# Patient Record
Sex: Male | Born: 2005
Health system: Southern US, Community
[De-identification: ages and names within clinical notes are randomized; demographics above are authoritative.]

## PROBLEM LIST (undated history)

## (undated) DIAGNOSIS — H9190 Unspecified hearing loss, unspecified ear: Secondary | ICD-10-CM

## (undated) DIAGNOSIS — F419 Anxiety disorder, unspecified: Secondary | ICD-10-CM

## (undated) DIAGNOSIS — T7840XA Allergy, unspecified, initial encounter: Secondary | ICD-10-CM

## (undated) DIAGNOSIS — G4700 Insomnia, unspecified: Secondary | ICD-10-CM

## (undated) DIAGNOSIS — F909 Attention-deficit hyperactivity disorder, unspecified type: Secondary | ICD-10-CM

## (undated) DIAGNOSIS — F329 Major depressive disorder, single episode, unspecified: Secondary | ICD-10-CM

## (undated) DIAGNOSIS — F840 Autistic disorder: Secondary | ICD-10-CM

## (undated) DIAGNOSIS — F32A Depression, unspecified: Secondary | ICD-10-CM

## (undated) HISTORY — DX: Allergy, unspecified, initial encounter: T78.40XA

## (undated) HISTORY — DX: Depression, unspecified: F32.A

## (undated) HISTORY — DX: Attention-deficit hyperactivity disorder, unspecified type: F90.9

## (undated) HISTORY — DX: Unspecified hearing loss, unspecified ear: H91.90

## (undated) HISTORY — DX: Anxiety disorder, unspecified: F41.9

## (undated) HISTORY — DX: Insomnia, unspecified: G47.00

## (undated) HISTORY — DX: Major depressive disorder, single episode, unspecified: F32.9

## (undated) HISTORY — DX: Autistic disorder: F84.0

---
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
## 2024-08-14 ENCOUNTER — Emergency Department (HOSPITAL_COMMUNITY): Payer: Self-pay

## 2024-08-14 ENCOUNTER — Other Ambulatory Visit: Payer: Self-pay

## 2024-08-14 ENCOUNTER — Observation Stay (HOSPITAL_COMMUNITY)
Admission: EM | Admit: 2024-08-14 | Discharge: 2024-08-16 | Disposition: A | Discharge status: Home / Self Care | Payer: Self-pay | Attending: Internal Medicine | Admitting: Internal Medicine

## 2024-08-14 ENCOUNTER — Encounter (HOSPITAL_COMMUNITY): Payer: Self-pay | Admitting: Emergency Medicine

## 2024-08-14 DIAGNOSIS — R519 Headache, unspecified: Secondary | ICD-10-CM

## 2024-08-14 DIAGNOSIS — R202 Paresthesia of skin: Secondary | ICD-10-CM | POA: Insufficient documentation

## 2024-08-14 DIAGNOSIS — I4711 Inappropriate sinus tachycardia, so stated: Secondary | ICD-10-CM

## 2024-08-14 DIAGNOSIS — R079 Chest pain, unspecified: Secondary | ICD-10-CM | POA: Insufficient documentation

## 2024-08-14 DIAGNOSIS — R000 Tachycardia, unspecified: Principal | ICD-10-CM | POA: Diagnosis present

## 2024-08-14 MED ORDER — SODIUM CHLORIDE 0.9 % IV BOLUS
1000.0000 mL | Freq: Once | INTRAVENOUS | Status: AC
  Administered 2024-08-14: 1000 mL via INTRAVENOUS

## 2024-08-14 MED ORDER — LORAZEPAM 2 MG/ML IJ SOLN
1.0000 mg | Freq: Once | INTRAMUSCULAR | Status: AC
  Administered 2024-08-14: 1 mg via INTRAVENOUS
  Filled 2024-08-14: qty 1

## 2024-08-14 NOTE — ED Triage Notes (Signed)
 Pt bib ems from home with initial c/o shob, pt to be tachycardic at 170s upon arrival. HR 140s here. Abruptly stopped his clonidine  x 1 week ago.  NS  160/80 170HR 99RA, placed on 2lpm [place] for comfort

## 2024-08-14 NOTE — ED Provider Notes (Signed)
 " [facility]-EMERGENCY DEPT [facility] Emergency Department Provider Note MRN:  [id number]  Arrival date & time: 08/15/24     Chief Complaint   Tachycardia   History of Present Illness   Franklin Arnold is a 19 y.o. year-old male with a history of autism presenting to the ED with chief complaint of tachycardia.  Sudden onset shortness of breath and tachycardia at home.  Reportedly heart rate in the 170s.  Patient endorsing some mild chest discomfort, some migratory tingling to the hands and legs.  Denies headache, no numbness or weakness, no abdominal pain.  Shortness of breath is a bit improved.  Recently stopped his clonidine .  Review of Systems  A thorough review of systems was obtained and all systems are negative except as noted in the HPI and PMH.   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   Insomnia     Past Surgical History:  Procedure Laterality Date   ADENOIDECTOMY N/A 2006    Family History  Problem Relation Age of Onset   Hypertension Mother    Hypertension Father    Diabetes Father    ADD / ADHD Brother    Schizophrenia Paternal Aunt    Heart failure Maternal Grandmother    Bipolar disorder Paternal Grandmother     Social History   Socioeconomic History   Marital status: Single    Spouse name: Not on file   Number of children: Not on file   Years of education: Not on file   Highest education level: Not on file  Occupational History   Occupation: student    Comment: [employer]  Tobacco Use   Smoking status: Never   Smokeless tobacco: Never  Vaping Use   Vaping status: Never Used  Substance and Sexual Activity   Alcohol use: No   Drug use: No   Sexual activity: Never  Other Topics Concern   Not on file  Social History Narrative   Parents are divorced.    Lives with his mother, one brother and their cat.   Two brothers and sister live independently in [place], [place].   His father is remarried with children and lives in [place], [place].   The patient desires more contact with his father.   Social Drivers of Health   Tobacco Use: Low Risk (08/14/2024)   Patient History    Smoking Tobacco Use: Never    Smokeless Tobacco Use: Never    Passive Exposure: Not on file  Financial Resource Strain: Not on file  Food Insecurity: Not on file  Transportation Needs: Not on file  Physical Activity: Not on file  Stress: Not on file  Social Connections: Not on file  Intimate Partner Violence: Not on file  Depression (PHQ2-9): Medium Risk (12/24/2022)   Depression (PHQ2-9)    PHQ-2 Score: 10  Alcohol Screen: Low Risk (12/24/2022)   Alcohol Screen    Last Alcohol Screening Score (AUDIT): 0  Housing: Not on file  Utilities: Not on file  Health Literacy: Not on file     Physical Exam   Vitals:   08/15/24 0300 08/15/24 0313  BP: 120/72   Pulse: (!) 121 (!) 111  Resp: 19 (!) 22  Temp:    SpO2: 100% 98%    CONSTITUTIONAL:  Well-appearing, mildly anxious NEURO/PSYCH:  Alert and oriented x 3, no focal deficits EYES:  eyes equal and reactive ENT/NECK:  no LAD, no JVD CARDIO: Tachycardic rate, well-perfused, normal S1 and S2 PULM:  CTAB no wheezing or rhonchi GI/GU:  non-distended, non-tender MSK/SPINE:  No gross deformities, no edema SKIN:  no rash, atraumatic   *Additional and/or pertinent findings included in MDM below  Diagnostic and Interventional Summary    EKG Interpretation Date/Time:  Tuesday August 15 2024 01:55:07 EST Ventricular Rate:  125 PR Interval:  166 QRS Duration:  77 QT Interval:  303 QTC Calculation: 437 R Axis:   111  Text Interpretation: Sinus tachycardia LAE, consider biatrial enlargement Right axis deviation Confirmed by Theadore Sharper [phone]) on 08/15/2024 2:14:00 AM       Labs Reviewed  CBC - Abnormal;  Notable for the following components:      Result Value   WBC 12.7 (*)    RBC 6.32 (*)    Hemoglobin 18.8 (*)    HCT 53.9 (*)    All other components within normal limits  BASIC METABOLIC PANEL WITH GFR - Abnormal; Notable for the following components:   Chloride 97 (*)    Glucose, Bld 135 (*)    Creatinine, Ser 1.35 (*)    Anion gap 19 (*)    All other components within normal limits  MAGNESIUM  TROPONIN T, HIGH SENSITIVITY  TROPONIN T, HIGH SENSITIVITY    CT Angio Chest Pulmonary Embolism (PE) W or WO Contrast  Final Result    DG Chest Port 1 View  Final Result      Medications  iohexol  (OMNIPAQUE ) 350 MG/ML injection 75 mL (has no administration in time range)  sodium chloride  0.9 % bolus 1,000 mL (0 mLs Intravenous Stopped 08/15/24 0111)  LORazepam  (ATIVAN ) injection 1 mg (1 mg Intravenous Given 08/14/24 2349)  iohexol  (OMNIPAQUE ) 350 MG/ML injection 75 mL (75 mLs Intravenous Contrast Given 08/15/24 0108)  sodium chloride  0.9 % bolus 1,000 mL (1,000 mLs Intravenous New Bag/Given 08/15/24 0146)     Procedures  /  Critical Care Procedures  ED Course and Medical Decision Making  Initial Impression and Ddx Differential diagnosis includes arrhythmia, pulmonary embolism, panic attack, pneumothorax heart rate is improved down to the 120s to 130s, hypertensive on arrival as well.  Providing some fluids, Ativan , will reassess.  Past medical/surgical history that increases complexity of ED encounter: Autism  Interpretation of Diagnostics I personally reviewed the EKG and my interpretation is as follows: Sinus tachycardia  No significant blood count or electrolyte disturbance.  Some evidence of hemoconcentration.  CTA is unremarkable.  Patient Reassessment and Ultimate Disposition/Management     Patient continues to exhibit tachycardia, cardiology consulted with the question is patient in some type of subtle SVT or ectopic atrial tachycardia.  Dr. Curtistine Farr evaluated the  patient in person and will admit patient for observation.  Patient management required discussion with the following services or consulting groups:  Cardiology  Complexity of Problems Addressed Acute illness or injury that poses threat of life of bodily function  Additional Data Reviewed and Analyzed Further history obtained from: Further history from spouse/family member  Additional Factors Impacting ED Encounter Risk Consideration of hospitalization  Sharper HERO. Theadore, MD [facility] Emergency Medicine [facility] [email or web address]  Final Clinical Impressions(s) / ED Diagnoses     ICD-10-CM   1. Tachycardia  R00.0       ED Discharge Orders     None  Discharge Instructions Discussed with and Provided to Patient:   Discharge Instructions   None      Theadore Ozell HERO, MD 08/15/24 [phone]  "

## 2024-08-15 ENCOUNTER — Other Ambulatory Visit: Payer: Self-pay | Admitting: Student

## 2024-08-15 ENCOUNTER — Observation Stay (HOSPITAL_COMMUNITY)
Admit: 2024-08-15 | Discharge: 2024-08-15 | Disposition: A | Discharge status: Home / Self Care | Payer: Self-pay | Attending: Internal Medicine | Admitting: Internal Medicine

## 2024-08-15 ENCOUNTER — Emergency Department (HOSPITAL_COMMUNITY): Payer: Self-pay

## 2024-08-15 ENCOUNTER — Observation Stay (HOSPITAL_COMMUNITY): Payer: Self-pay

## 2024-08-15 DIAGNOSIS — R000 Tachycardia, unspecified: Principal | ICD-10-CM

## 2024-08-15 DIAGNOSIS — R519 Headache, unspecified: Secondary | ICD-10-CM

## 2024-08-15 LAB — BASIC METABOLIC PANEL WITH GFR
Anion gap: 11 (ref 5–15)
Anion gap: 19 — ABNORMAL HIGH (ref 5–15)
BUN: 12 mg/dL (ref 6–20)
BUN: 15 mg/dL (ref 6–20)
CO2: 21 mmol/L — ABNORMAL LOW (ref 22–32)
CO2: 22 mmol/L (ref 22–32)
Calcium: 10.2 mg/dL (ref 8.9–10.3)
Calcium: 8.4 mg/dL — ABNORMAL LOW (ref 8.9–10.3)
Chloride: 106 mmol/L (ref 98–111)
Chloride: 97 mmol/L — ABNORMAL LOW (ref 98–111)
Creatinine, Ser: 1.08 mg/dL (ref 0.61–1.24)
Creatinine, Ser: 1.35 mg/dL — ABNORMAL HIGH (ref 0.61–1.24)
GFR, Estimated: >60 mL/min
GFR, Estimated: >60 mL/min
Glucose, Bld: 135 mg/dL — ABNORMAL HIGH (ref 70–99)
Glucose, Bld: 93 mg/dL (ref 70–99)
Potassium: 3.7 mmol/L (ref 3.5–5.1)
Potassium: 4.1 mmol/L (ref 3.5–5.1)
Sodium: 138 mmol/L (ref 135–145)
Sodium: 138 mmol/L (ref 135–145)

## 2024-08-15 LAB — CBC
HCT: 40.1 % (ref 39.0–52.0)
HCT: 42.8 % (ref 39.0–52.0)
HCT: 53.9 % — ABNORMAL HIGH (ref 39.0–52.0)
Hemoglobin: 13.7 g/dL (ref 13.0–17.0)
Hemoglobin: 14.7 g/dL (ref 13.0–17.0)
Hemoglobin: 18.8 g/dL — ABNORMAL HIGH (ref 13.0–17.0)
MCH: 29.2 pg (ref 26.0–34.0)
MCH: 29.3 pg (ref 26.0–34.0)
MCH: 29.7 pg (ref 26.0–34.0)
MCHC: 34.2 g/dL (ref 30.0–36.0)
MCHC: 34.3 g/dL (ref 30.0–36.0)
MCHC: 34.9 g/dL (ref 30.0–36.0)
MCV: 84.9 fL (ref 80.0–100.0)
MCV: 85.3 fL (ref 80.0–100.0)
MCV: 85.9 fL (ref 80.0–100.0)
Platelets: 228 K/uL (ref 150–400)
Platelets: 236 K/uL (ref 150–400)
Platelets: 306 K/uL (ref 150–400)
RBC: 4.67 MIL/uL (ref 4.22–5.81)
RBC: 5.04 MIL/uL (ref 4.22–5.81)
RBC: 6.32 MIL/uL — ABNORMAL HIGH (ref 4.22–5.81)
RDW: 11.8 % (ref 11.5–15.5)
RDW: 11.9 % (ref 11.5–15.5)
RDW: 12 % (ref 11.5–15.5)
WBC: 10 K/uL (ref 4.0–10.5)
WBC: 11.9 K/uL — ABNORMAL HIGH (ref 4.0–10.5)
WBC: 12.7 K/uL — ABNORMAL HIGH (ref 4.0–10.5)
nRBC: 0 % (ref 0.0–0.2)
nRBC: 0 % (ref 0.0–0.2)
nRBC: 0 % (ref 0.0–0.2)

## 2024-08-15 LAB — TROPONIN T, HIGH SENSITIVITY
Troponin T High Sensitivity: <15 ng/L (ref 0–19)
Troponin T High Sensitivity: <15 ng/L (ref 0–19)

## 2024-08-15 LAB — MRSA NEXT GEN BY PCR, NASAL: MRSA by PCR Next Gen: NOT DETECTED

## 2024-08-15 LAB — MAGNESIUM: Magnesium: 2.1 mg/dL (ref 1.7–2.4)

## 2024-08-15 LAB — TSH: TSH: 1.55 u[IU]/mL (ref 0.350–4.500)

## 2024-08-15 MED ORDER — SODIUM CHLORIDE 0.9 % IV BOLUS
1000.0000 mL | Freq: Once | INTRAVENOUS | Status: AC
  Administered 2024-08-15: 1000 mL via INTRAVENOUS

## 2024-08-15 MED ORDER — IOHEXOL 350 MG/ML SOLN
75.0000 mL | Freq: Once | INTRAVENOUS | Status: AC | PRN
  Administered 2024-08-15: 75 mL via INTRAVENOUS

## 2024-08-15 MED ORDER — POLYETHYLENE GLYCOL 3350 17 G PO PACK: 17.0000 g | PACK | Freq: Every day | ORAL | Status: DC | PRN

## 2024-08-15 MED ORDER — ONDANSETRON HCL 4 MG/2ML IJ SOLN
4.0000 mg | Freq: Four times a day (QID) | INTRAMUSCULAR | Status: DC | PRN
  Administered 2024-08-16: 4 mg via INTRAVENOUS
  Filled 2024-08-15: qty 2

## 2024-08-15 MED ORDER — ACETAMINOPHEN 325 MG PO TABS
650.0000 mg | ORAL_TABLET | ORAL | Status: DC | PRN
  Administered 2024-08-16: 650 mg via ORAL
  Filled 2024-08-15: qty 2

## 2024-08-15 MED ORDER — IOHEXOL 350 MG/ML SOLN: 75.0000 mL | Freq: Once | INTRAVENOUS | Status: DC | PRN

## 2024-08-15 MED ORDER — METOPROLOL TARTRATE 25 MG PO TABS
25.0000 mg | ORAL_TABLET | Freq: Two times a day (BID) | ORAL | Status: DC
  Administered 2024-08-15 – 2024-08-16 (×3): 25 mg via ORAL
  Filled 2024-08-15 (×4): qty 1

## 2024-08-15 NOTE — H&P (Addendum)
 "   Cardiology Admission History and Physical   Patient ID: Franklin Arnold MRN: [id number]; DOB: 07-Jun-2006   Admission date: 08/14/2024  PCP:  Donzella Lauraine SAILOR, DO   [facility] Providers Cardiologist:  None       Chief Complaint:  tachycardia  Patient Profile:   Franklin Arnold is a 19 y.o. male with a hx of autism who is being seen 08/15/2024 for the evaluation of tachycardia at the request of Dr. Theadore.   History of Present Illness:   Was in his [place], playing video games tonight. One minute he was feeling normal and the next minute he suddenly appreciated sudden onset palpitations and SOB. He woke up his mom who noticed that he was clammy so called EMS. EMS arrived and HR was 170 (they report sinus tachycardia). He was brought to the emergency room.    In the ED, HR initially 150s, momentarily jumped to 170bpm, and then back down to the 120s. Labs showed AKI with Cr 1.35, up from prior Cr 1-1.2. CBC with all cell lines up compared to prior CBC, WBC 12.7 from 6.9, Hfb 18.8 from 16.2, and Plts 306 from 191. Hstrops negative x2. S/p 2L IVF in the ED, now HR improved to the 120s. CT PE performed, negative.    EKG with positive P wave in I and inferior leads.   He was previously on clonidine  about 1.5 years ago. Had stopped cold turkey 1.5 years ago and had an episode of rebound hypertension. However, has not been on clonidine  for 1.5 years now. Not on any medications. Denies caffeine usage, stimulant usage, EtOH usage. Doesn't think he's dehydrated because he's drinking all the time. Denies fevers, chills, N/V, recent sick contacts.   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   Insomnia     Past Surgical History:  Procedure Laterality Date   ADENOIDECTOMY N/A 2006     Medications Prior to Admission: Prior to  Admission medications  Not on File     Allergies:   Allergies[1]  Social History:   Social History   Socioeconomic History   Marital status: Single    Spouse name: Not on file   Number of children: Not on file   Years of education: Not on file   Highest education level: Not on file  Occupational History   Occupation: student    Comment: [employer]  Tobacco Use   Smoking status: Never   Smokeless tobacco: Never  Vaping Use   Vaping status: Never Used  Substance and Sexual Activity   Alcohol use: No   Drug use: No   Sexual activity: Never  Other Topics Concern   Not on file  Social History Narrative   Parents are divorced.   Lives with his mother, one brother and their cat.   Two brothers and sister live independently in [place], [place].   His father is remarried with children and lives in [place], [place].   The patient desires more contact with his father.   Social Drivers of Health   Tobacco Use: Low Risk (08/14/2024)   Patient History    Smoking Tobacco Use: Never    Smokeless Tobacco Use: Never    Passive Exposure: Not on file  Financial Resource Strain: Not on file  Food  Insecurity: Not on file  Transportation Needs: Not on file  Physical Activity: Not on file  Stress: Not on file  Social Connections: Not on file  Intimate Partner Violence: Not on file  Depression (PHQ2-9): Medium Risk (12/24/2022)   Depression (PHQ2-9)    PHQ-2 Score: 10  Alcohol Screen: Low Risk (12/24/2022)   Alcohol Screen    Last Alcohol Screening Score (AUDIT): 0  Housing: Not on file  Utilities: Not on file  Health Literacy: Not on file    Family History:   The patient's family history includes ADD / ADHD in his brother; Bipolar disorder in his paternal grandmother; Diabetes in his father; Heart failure in his maternal grandmother; Hypertension in his father and mother; Schizophrenia in his paternal aunt.    ROS:  Please see the history of present illness.  All  other ROS reviewed and negative.     Physical Exam/Data:   Vitals:   08/15/24 0154 08/15/24 0223 08/15/24 0230 08/15/24 0254  BP:   138/80   Pulse: (!) 121 (!) 131 (!) 122 (!) 125  Resp: 18 17 13 16   Temp:      SpO2: 100% 100% 100% 99%  Weight:      Height:       No intake or output data in the 24 hours ending 08/15/24 0305    08/14/2024   11:36 PM 12/24/2022    8:32 AM 09/21/2022    8:27 PM  Last 3 Weights  Weight (lbs) 141 lb 1.5 oz 139 lb 1.6 oz 125 lb  Weight (kg) 64 kg 63.095 kg 56.7 kg     Body mass index is 19.68 kg/m.  General:  Well nourished, well developed, in no acute distress HEENT: normal Neck: no JVD Vascular: No carotid bruits; Distal pulses 2+ bilaterally Cardiac:  tachycardic, regular rhythm Lungs:  clear to auscultation bilaterally, no wheezing, rhonchi or rales  Abd: soft, nontender, no hepatomegaly  Ext: no edema Musculoskeletal:  No deformities, BUE and BLE strength normal and equal Skin: warm and dry   EKG:  The EKG was personally reviewed and demonstrates:  sinus tachycardia, LAE, P wave morphology positive in I and in inferior leads and negative in V1 Telemetry:  Telemetry was personally reviewed and demonstrates:  sinus tachycardia initially. There is a period where he HR jumps from HR 120s to HR 170s, P wave does not appear to change axis but soon gets buried into the T wave.  Laboratory Data:  High Sensitivity Troponin:  No results for input(s): TROPONINIHS in the last 720 hours.    Chemistry Recent Labs  Lab 08/14/24 2343  NA 138  K 3.7  CL 97*  CO2 22  GLUCOSE 135*  BUN 15  CREATININE 1.35*  CALCIUM 10.2  MG 2.1  GFRNONAA >60  ANIONGAP 19*    No results for input(s): PROT, ALBUMIN, AST, ALT, ALKPHOS, BILITOT in the last 168 hours. Lipids No results for input(s): CHOL, TRIG, HDL, LABVLDL, LDLCALC, CHOLHDL in the last 168 hours. Hematology Recent Labs  Lab 08/14/24 2343  WBC 12.7*  RBC 6.32*  HGB  18.8*  HCT 53.9*  MCV 85.3  MCH 29.7  MCHC 34.9  RDW 11.8  PLT 306   Thyroid  No results for input(s): TSH, FREET4 in the last 168 hours. BNPNo results for input(s): BNP, PROBNP in the last 168 hours.  DDimer No results for input(s): DDIMER in the last 168 hours.   Radiology/Studies:  CT Angio Chest Pulmonary Embolism (PE) W or  WO Contrast Result Date: 08/15/2024 EXAM: CTA of the Chest with contrast for PE 08/15/2024 01:13:08 AM TECHNIQUE: CTA of the chest was performed without and with the administration of 75 mL of intravenous iohexol  (OMNIPAQUE ) 350 MG/ML injection. Multiplanar reformatted images are provided for review. MIP images are provided for review. Automated exposure control, iterative reconstruction, and/or weight based adjustment of the mA/kV was utilized to reduce the radiation dose to as low as reasonably achievable. COMPARISON: None available. CLINICAL HISTORY: Pulmonary embolism (PE) suspected, high prob. Tachycardia. FINDINGS: PULMONARY ARTERIES: Pulmonary arteries are adequately opacified for evaluation. No pulmonary embolism. Main pulmonary artery is normal in caliber. MEDIASTINUM: The heart and pericardium demonstrate no acute abnormality. There is no acute abnormality of the thoracic aorta. LYMPH NODES: No mediastinal, hilar or axillary lymphadenopathy. LUNGS AND PLEURA: The lungs are without acute process. No focal consolidation or pulmonary edema. No pleural effusion or pneumothorax. UPPER ABDOMEN: Limited images of the upper abdomen are unremarkable. SOFT TISSUES AND BONES: Mild lower thoracic dextroscoliosis. No acute soft tissue abnormality. IMPRESSION: 1. No pulmonary embolism. 2. Negative CT chest. Electronically signed by: Pinkie Pebbles MD 08/15/2024 01:21 AM EST RP Workstation: [id number]   DG Chest Port 1 View Result Date: 08/15/2024 EXAM: 1 VIEW(S) XRAY OF THE CHEST 08/15/2024 12:01:00 AM COMPARISON: 03/06/2016. CLINICAL HISTORY: sob sob FINDINGS: LUNGS  AND PLEURA: Questionable left perihilar opacity which could reflect infiltrate and/or pneumonia. No pleural effusion. No pneumothorax. HEART AND MEDIASTINUM: No acute abnormality of the cardiac and mediastinal silhouettes. BONES AND SOFT TISSUES: No acute osseous abnormality. IMPRESSION: 1. Questionable left perihilar opacity, possibly representing infiltrate and/or pneumonia. Electronically signed by: Franky Crease MD 08/15/2024 12:06 AM EST RP Workstation: [id number]     Assessment and Plan:   WAGNER TANZI is a 19 y.o. male with a hx of autism who is being seen 08/15/2024 for the evaluation of tachycardia at the request of Dr. Theadore.   Tachycardia Sudden onset tachycardia while resting at home seems suspicious for some sort of tachyarrhythemia. Wasn't stressed, scared, denies caffeine, EtOH, stimulant use to precipitate the event. EKG and telemetry with normal P wave morphology, positive in I and inferior leads and negative in V1. Seems to be sinus tachycardia though there is a period on telemetry where his HR jumps from 120 to 170bpm, however no change in P wave morphology, differential here would be sinus tachycardia vs ectopic right atrial tachycardia. CT PE negative for PE. While he denies dehydration, he has a small AKI and his CBC does appear hemoconcentrated, and his tachycardia has otherwise seems to have improved with IVF. Will plan to observe overnight as his sinus tachycardia still in the 120s. Anticipate will need TTE in the morning to assess for structural heart disease and likely a ziopatch to assess further for arrhythmia as outpatient. :: previously on clonidine  but has not been on that medication for 1.5 years, ruling out clonidine  withdrawal as etiology - s/p 2L IVF, getting one additional liter of IVF - TTE in the AM - anticipate discharge with ziopatch to assess for arrhythmia as outpatient - f/u TSH  Inpatient Bundle: - Code: FULL - Access: PIVs - Diet: regular - GI ppx: not  indicated - Bowel: miralax  PRN - DVT ppx: ambulatory, SCDs - Dispo: observation, pending improvement in tachycardia  Risk Assessment/Risk Scores:     Severity of Illness: The appropriate patient status for this patient is OBSERVATION. Observation status is judged to be reasonable and necessary in order to provide the required intensity  of service to ensure the patient's safety. The patient's presenting symptoms, physical exam findings, and initial radiographic and laboratory data in the context of their medical condition is felt to place them at decreased risk for further clinical deterioration. Furthermore, it is anticipated that the patient will be medically stable for discharge from the hospital within 2 midnights of admission.    For questions or updates, please contact [facility] Please consult [email or web address] for contact info under     Signed, Curtistine LITTIE Farr, MD  08/15/2024 3:05 AM       [1]  Allergies Allergen Reactions   Soy Allergy (Obsolete) Anaphylaxis   Trazodone And Nefazodone Other (See Comments)    BP 50/29 with HR 49; very bad experience; got to [facility]   "

## 2024-08-15 NOTE — Progress Notes (Signed)
" ° °  Went to the bedside to check on Franklin Arnold. He is accompanied by his parents. Franklin Arnold notes that his HR has improved with metoprolol  - now sinus rhythm in the 80's on the monitor. Still c/o sharp pain around the right upper orbit. Echo has not been performed. Family does not feel that he is safe for discharge. They would like further evaluation of headache. I performed a brief neuro exam on Franklin Arnold which is non-focal, he denies vision or hearing changes (has baseline tinnitus), denies fever, chills, nasal discharge or associated symptoms. Reports he does not usually get head pain.  Will order non-contrast head CT.  Plan to keep overnight for observation until echo results are back. Will ask cardiac EP to evaluate in am. Patient and family want the exact reason for his tachycardia if possible and would like to pursue any potential fixes such as ablation if it would mean that he would not need to be on medication.  Franklin KYM Maxcy, MD, [facility], FNLA, FACP  [place]  [facility]  Medical Director of the Advanced Lipid Disorders &  Cardiovascular Risk Reduction Clinic Diplomate of the American Board of Clinical Lipidology Attending Cardiologist  Direct Dial: [phone]  Fax: [phone]  Website:  [email or web address]  "

## 2024-08-15 NOTE — ED Notes (Signed)
 Hilty, MD notified of questionable cbc results. Awaiting new orders at this time.

## 2024-08-15 NOTE — Discharge Summary (Signed)
 " Discharge Summary   Patient ID: Franklin Arnold MRN: [id number]; DOB: [birth year]  Admit date: 08/14/2024 Discharge date: 08/16/2024  PCP:  Donzella Lauraine SAILOR, DO   [facility] Providers Cardiologist:  Vinie JAYSON Maxcy, MD       Discharge Diagnoses  Principal Problem:   Tachycardia Active Problems:   Headache around the eyes   Inappropriate sinus tachycardia   Diagnostic Studies/Procedures  TTE 08/16/24  IMPRESSIONS     1. Left ventricular ejection fraction, by estimation, is 55 to 60%. The  left ventricle has normal function. The left ventricle has no regional  wall motion abnormalities. There is mild asymmetric left ventricular  hypertrophy of the basal segment. Left  ventricular diastolic parameters were normal.   2. Right ventricular systolic function is low normal. The right  ventricular size is normal.   3. The mitral valve is abnormal. Trivial mitral valve regurgitation. No  evidence of mitral stenosis. 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   4. The aortic valve is tricuspid. Aortic valve regurgitation is not  visualized. No aortic stenosis is present.   Head CT wo contrast 08/15/24 FINDINGS:   BRAIN AND VENTRICLES: No acute hemorrhage. No evidence of acute infarct. No hydrocephalus. No extra-axial collection. No mass effect or midline shift.   ORBITS: No acute abnormality.   SINUSES: No acute abnormality.   SOFT TISSUES AND SKULL: No acute soft tissue abnormality. No skull fracture.   IMPRESSION: 1. No acute intracranial abnormality.  Pulmonary CTA 08/15/24 FINDINGS:   PULMONARY ARTERIES: Pulmonary arteries are adequately opacified for evaluation. No pulmonary embolism. Main pulmonary artery is normal in caliber.   MEDIASTINUM: The heart and pericardium demonstrate no acute abnormality. There is no acute abnormality of the thoracic aorta.   LYMPH NODES: No mediastinal, hilar or axillary lymphadenopathy.   LUNGS  AND PLEURA: The lungs are without acute process. No focal consolidation or pulmonary edema. No pleural effusion or pneumothorax.   UPPER ABDOMEN: Limited images of the upper abdomen are unremarkable.   SOFT TISSUES AND BONES: Mild lower thoracic dextroscoliosis. No acute soft tissue abnormality.   IMPRESSION: 1. No pulmonary embolism. 2. Negative CT chest. _____________   History of Present Illness   Franklin Arnold is a 19 y.o. male with a history of autism, anxiety/depression, and ADHD who presented to the emergency department for tachycardia.  Reported that he had a sudden onset of palpitations, diaphoresis, and shortness of breath.  When he arrived to the emergency department he had a heart rate of 170 BPM.  Patient had negative high-sensitivity troponins x2, TSH within normal limits, hemoglobin of 14.7.  Pulmonary CTA showed no acute findings.  The patient was admitted under cardiology service for concerns of tachycardia that are possibly SVT.   Hospital Course   Consultants: None   Inappropriate sinus tachycardia. Presented to the hospital for palpitations and shortness of breath.  Was found to have heart rates as high as the 170s.  Patient had a negative workup as mentioned above.  A 2-week cardiac monitor was placed on the patient.  The patient was also started on metoprolol  tartrate.  Discussed with the patient and he feels like his symptoms have improved since starting the metoprolol  tartrate 25 mg twice daily.  EP was consulted and saw the patient and felt like the patient's rhythm was most likely sinus tachycardia.  An echocardiogram was done and showed a normal LVEF of 55 to 60%, no RWMA, normal RV systolic  function, mild holosystolic prolapse of the mitral valve, grossly normal valve function. The patient was seen by Dr Mona and felt to be stable for discharge. Has outpatient follow-up scheduled with Josefa Beauvais on 09/27/2024 to review ZIO monitor results. Start metoprolol   tartrate 25 mg twice daily.   Headache with discomfort around eyes Patient reported having a headache and discomfort around eyes. A head CT was done and found no acute intracranial abnormality.     Did the patient have an acute coronary syndrome (MI, NSTEMI, STEMI, etc) this admission?:  No                               Did the patient have a percutaneous coronary intervention (stent / angioplasty)?:  No.         _____________  Discharge Vitals Blood pressure 127/82, pulse 71, temperature 97.8 F (36.6 C), temperature source Oral, resp. rate 14, height 5' 11 (1.803 m), weight 68.6 kg, SpO2 98%.  Filed Weights   08/14/24 2336 08/15/24 2015  Weight: 64 kg 68.6 kg    Labs & Radiologic Studies  CBC Recent Labs    08/15/24 0613 08/15/24 1127  WBC 11.9* 10.0  HGB 13.7 14.7  HCT 40.1 42.8  MCV 85.9 84.9  PLT 228 236   Basic Metabolic Panel Recent Labs    98/80/73 2343 08/15/24 0613  NA 138 138  K 3.7 4.1  CL 97* 106  CO2 22 21*  GLUCOSE 135* 93  BUN 15 12  CREATININE 1.35* 1.08  CALCIUM 10.2 8.4*  MG 2.1  --    Liver Function Tests No results for input(s): AST, ALT, ALKPHOS, BILITOT, PROT, ALBUMIN in the last 72 hours. No results for input(s): LIPASE, AMYLASE in the last 72 hours. High Sensitivity Troponin:   No results for input(s): TROPONINIHS in the last 720 hours.  Recent Labs  Lab 08/14/24 2343 08/15/24 0137  TRNPT <15 <15    BNP Invalid input(s): POCBNP No results for input(s): PROBNP in the last 72 hours.  No results for input(s): BNP in the last 72 hours.  D-Dimer No results for input(s): DDIMER in the last 72 hours. Hemoglobin A1C No results for input(s): HGBA1C in the last 72 hours. Fasting Lipid Panel No results for input(s): CHOL, HDL, LDLCALC, TRIG, CHOLHDL, LDLDIRECT in the last 72 hours. No results found for: LIPOA  Thyroid  Function Tests Recent Labs    08/15/24 0137  TSH 1.550    _____________  ECHOCARDIOGRAM COMPLETE Result Date: 08/16/2024    ECHOCARDIOGRAM REPORT   Patient Name:   Franklin Arnold Date of Exam: 08/16/2024 Medical Rec #:  [id number]       Height:       71.0 in Accession #:    [id number]      Weight:       151.2 lb Date of Birth:  02-Aug-2005       BSA:          1.872 m Patient Age:    19 years        BP:           121/89 mmHg Patient Gender: M               HR:           59 bpm. Exam Location:  Inpatient Procedure: 2D Echo, Cardiac Doppler and Color Doppler (Both Spectral and Color  Flow Doppler were utilized during procedure). Indications:    R00.8 Other abnormalities of heart beat. Atrial tachycardia  History:        Patient has no prior history of Echocardiogram examinations.                 Abnormal ECG; Arrythmias:Tachycardia.  Sonographer:    Ellouise Mose RDCS Referring Phys: [phone] VINIE BROCKS HILTY  Sonographer Comments: Technically difficult study due to poor echo windows. Difficult windows due to thin habitus. IMPRESSIONS  1. Left ventricular ejection fraction, by estimation, is 55 to 60%. The left ventricle has normal function. The left ventricle has no regional wall motion abnormalities. There is mild asymmetric left ventricular hypertrophy of the basal segment. Left ventricular diastolic parameters were normal.  2. Right ventricular systolic function is low normal. The right ventricular size is normal.  3. The mitral valve is abnormal. Trivial mitral valve regurgitation. No evidence of mitral stenosis. 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  4. The aortic valve is tricuspid. Aortic valve regurgitation is not visualized. No aortic stenosis is present. Comparison(s): No prior Echocardiogram. FINDINGS  Left Ventricle: Left ventricular ejection fraction, by estimation, is 55 to 60%. The left ventricle has normal function. The left ventricle has no regional wall motion abnormalities. The left ventricular internal cavity size was normal in  size. There is  mild asymmetric left ventricular hypertrophy of the basal segment. Left ventricular diastolic parameters were normal. Right Ventricle: The right ventricular size is normal. No increase in right ventricular wall thickness. Right ventricular systolic function is low normal. Left Atrium: Left atrial size was normal in size. Right Atrium: Right atrial size was normal in size. Pericardium: There is no evidence of pericardial effusion. Mitral Valve: The mitral valve is abnormal. 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 Trivial mitral valve regurgitation. No evidence of mitral valve stenosis. Tricuspid Valve: The tricuspid valve is normal in structure. Tricuspid valve regurgitation is not demonstrated. No evidence of tricuspid stenosis. Aortic Valve: The aortic valve is tricuspid. Aortic valve regurgitation is not visualized. No aortic stenosis is present. Pulmonic Valve: The pulmonic valve was normal in structure. Pulmonic valve regurgitation is trivial. No evidence of pulmonic stenosis. Aorta: The aortic root and ascending aorta are structurally normal, with no evidence of dilitation. Venous: A systolic blunting flow pattern is recorded from the right upper pulmonary vein. IAS/Shunts: The interatrial septum was not well visualized.  LEFT VENTRICLE PLAX 2D LVIDd:         3.70 cm     Diastology LVIDs:         2.30 cm     LV e' medial:    10.70 cm/s LV PW:         1.10 cm     LV E/e' medial:  7.9 LV IVS:        1.25 cm     LV e' lateral:   13.50 cm/s LVOT diam:     2.12 cm     LV E/e' lateral: 6.3 LVOT Area:     3.55 cm  LV Volumes (MOD) LV vol d, MOD A2C: 66.9 ml LV vol d, MOD A4C: 70.1 ml LV vol s, MOD A2C: 22.9 ml LV vol s, MOD A4C: 31.6 ml LV SV MOD A2C:     44.0 ml LV SV MOD A4C:     70.1 ml LV SV MOD BP:      43.9 ml RIGHT VENTRICLE  IVC RV S prime:     10.70 cm/s  IVC diam: 0.98 cm TAPSE (M-mode): 1.8 cm                             PULMONARY VEINS                              Diastolic Velocity: 78.10 cm/s                             S/D Velocity:       0.70                             Systolic Velocity:  51.40 cm/s LEFT ATRIUM             Index       RIGHT ATRIUM          Index LA diam:        1.77 cm 0.95 cm/m  RA Area:     6.09 cm LA Vol (A2C):   8.1 ml  4.30 ml/m  RA Volume:   10.10 ml 5.39 ml/m LA Vol (A4C):   8.0 ml  4.27 ml/m LA Biplane Vol: 8.6 ml  4.61 ml/m                        PULMONIC VALVE AORTA                 PR End Diast Vel: 1.53 msec Ao Root diam: 2.99 cm Ao Asc diam:  2.56 cm MITRAL VALVE MV Area (PHT): 4.15 cm    SHUNTS MV Decel Time: 183 msec    Systemic Diam: 2.12 cm MV E velocity: 84.40 cm/s MV A velocity: 50.30 cm/s MV E/A ratio:  1.68 Kardie Tobb DO Electronically signed by Dub Huntsman DO Signature Date/Time: 08/16/2024/4:29:57 PM    Final    CT HEAD WO CONTRAST ( ) Result Date: 08/15/2024 EXAM: CT HEAD WITHOUT CONTRAST 08/15/2024 05:28:00 PM TECHNIQUE: CT of the head was performed without the administration of intravenous contrast. Automated exposure control, iterative reconstruction, and/or weight based adjustment of the mA/kV was utilized to reduce the radiation dose to as low as reasonably achievable. COMPARISON: None available. CLINICAL HISTORY: Headache, increasing frequency or severity; right posterior orbital pain, recent recurrent SVT FINDINGS: BRAIN AND VENTRICLES: No acute hemorrhage. No evidence of acute infarct. No hydrocephalus. No extra-axial collection. No mass effect or midline shift. ORBITS: No acute abnormality. SINUSES: No acute abnormality. SOFT TISSUES AND SKULL: No acute soft tissue abnormality. No skull fracture. IMPRESSION: 1. No acute intracranial abnormality. Electronically signed by: Franky Crease MD 08/15/2024 05:33 PM EST RP Workstation: [id number]   CT Angio Chest Pulmonary Embolism (PE) W or WO Contrast Result Date: 08/15/2024 EXAM: CTA of the Chest with contrast for PE 08/15/2024 01:13:08 AM TECHNIQUE: CTA of the  chest was performed without and with the administration of 75 mL of intravenous iohexol  (OMNIPAQUE ) 350 MG/ML injection. Multiplanar reformatted images are provided for review. MIP images are provided for review. Automated exposure control, iterative reconstruction, and/or weight based adjustment of the mA/kV was utilized to reduce the radiation dose to as low as reasonably achievable. COMPARISON: None available. CLINICAL HISTORY: Pulmonary embolism (PE) suspected, high prob. Tachycardia. FINDINGS: PULMONARY ARTERIES: Pulmonary arteries are  adequately opacified for evaluation. No pulmonary embolism. Main pulmonary artery is normal in caliber. MEDIASTINUM: The heart and pericardium demonstrate no acute abnormality. There is no acute abnormality of the thoracic aorta. LYMPH NODES: No mediastinal, hilar or axillary lymphadenopathy. LUNGS AND PLEURA: The lungs are without acute process. No focal consolidation or pulmonary edema. No pleural effusion or pneumothorax. UPPER ABDOMEN: Limited images of the upper abdomen are unremarkable. SOFT TISSUES AND BONES: Mild lower thoracic dextroscoliosis. No acute soft tissue abnormality. IMPRESSION: 1. No pulmonary embolism. 2. Negative CT chest. Electronically signed by: Pinkie Pebbles MD 08/15/2024 01:21 AM EST RP Workstation: [id number]   DG Chest Port 1 View Result Date: 08/15/2024 EXAM: 1 VIEW(S) XRAY OF THE CHEST 08/15/2024 12:01:00 AM COMPARISON: 03/06/2016. CLINICAL HISTORY: sob sob FINDINGS: LUNGS AND PLEURA: Questionable left perihilar opacity which could reflect infiltrate and/or pneumonia. No pleural effusion. No pneumothorax. HEART AND MEDIASTINUM: No acute abnormality of the cardiac and mediastinal silhouettes. BONES AND SOFT TISSUES: No acute osseous abnormality. IMPRESSION: 1. Questionable left perihilar opacity, possibly representing infiltrate and/or pneumonia. Electronically signed by: Franky Crease MD 08/15/2024 12:06 AM EST RP Workstation: [id number]     Disposition Pt is being discharged home today in good condition.  Follow-up Plans & Appointments  Follow-up Information     Emelia Josefa HERO, NP Follow up.   Specialty: Cardiology Why: Follow up with Josefa Emelia on 09/27/2024 to review ZIO monitor results. Contact information: [address] [place] [address] [phone]                Discharge Instructions     Increase activity slowly   Complete by: As directed        Discharge Medications Allergies as of 08/16/2024       Reactions   Soy Allergy (obsolete) Anaphylaxis   Trazodone And Nefazodone Other (See Comments)   BP 50/29 with HR 49; very bad experience; got to nirvana        Medication List     TAKE these medications    metoprolol  tartrate 25 MG tablet Commonly known as: LOPRESSOR  Take 1 tablet (25 mg total) by mouth 2 (two) times daily.         Outstanding Labs/Studies None  Duration of Discharge Encounter: APP Time: 15 minutes   Signed, Morse Clause, PA-C 08/16/2024, 5:04 PM     "

## 2024-08-15 NOTE — ED Notes (Signed)
 Hilty, MD notified of new patient EKG completion in chart.

## 2024-08-15 NOTE — ED Notes (Signed)
 Hilty MD notified of patient sudden change in HR. Pt HR increased to 160s, staying around 120s-130s at this time. Pt denies chest pain but reports sudden onset of right eye pressure. Respirations regular and unlabored.

## 2024-08-15 NOTE — ED Notes (Signed)
 Patient transported to CT

## 2024-08-15 NOTE — Progress Notes (Signed)
 "   DAILY PROGRESS NOTE   Patient Name: Franklin Arnold Date of Encounter: 08/15/2024 Cardiologist: None  Chief Complaint   No complaints  Patient Profile   Franklin Arnold is a 19 y.o. male with a hx of autism who is being seen 08/15/2024 for the evaluation of tachycardia at the request of Dr. Theadore.   Subjective   Stable overnight, no complaints. No further arrhythmias.  Objective   Vitals:   08/15/24 0645 08/15/24 0735 08/15/24 0736 08/15/24 0915  BP:  111/67  (!) 139/97  Pulse: 79 80  91  Resp: 20 (!) 21  (!) 23  Temp:   98.5 F (36.9 C)   TempSrc:   Oral   SpO2: 97% 100%  100%  Weight:      Height:       No intake or output data in the 24 hours ending 08/15/24 0928 Filed Weights   08/14/24 2336  Weight: 64 kg    Physical Exam   General appearance: alert and no distress Lungs: clear to auscultation bilaterally Heart: regular rate and rhythm Extremities: extremities normal, atraumatic, no cyanosis or edema Neurologic: Grossly normal  Inpatient Medications    Scheduled Meds:   Continuous Infusions:   PRN Meds: acetaminophen , iohexol , ondansetron  (ZOFRAN ) IV, polyethylene glycol   Labs   Results for orders placed or performed during the hospital encounter of 08/14/24 (from the past 48 hours)  CBC     Status: Abnormal   Collection Time: 08/14/24 11:43 PM  Result Value Ref Range   WBC 12.7 (H) 4.0 - 10.5 K/uL   RBC 6.32 (H) 4.22 - 5.81 MIL/uL   Hemoglobin 18.8 (H) 13.0 - 17.0 g/dL   HCT 46.0 (H) 60.9 - 47.9 %   MCV 85.3 80.0 - 100.0 fL   MCH 29.7 26.0 - 34.0 pg   MCHC 34.9 30.0 - 36.0 g/dL   RDW 88.1 88.4 - 84.4 %   Platelets 306 150 - 400 K/uL   nRBC 0.0 0.0 - 0.2 %    Comment: Performed at [facility], [address], [place], [place] [address]  Basic metabolic panel with GFR     Status: Abnormal   Collection Time: 08/14/24 11:43 PM  Result Value Ref Range   Sodium 138 135 - 145 mmol/L   Potassium 3.7 3.5 - 5.1 mmol/L   Chloride 97  (L) 98 - 111 mmol/L   CO2 22 22 - 32 mmol/L   Glucose, Bld 135 (H) 70 - 99 mg/dL    Comment: Glucose reference range applies only to samples taken after fasting for at least 8 hours.   BUN 15 6 - 20 mg/dL   Creatinine, Ser 8.64 (H) 0.61 - 1.24 mg/dL   Calcium 89.7 8.9 - 89.6 mg/dL   GFR, Estimated >39 >39 mL/min    Comment: (NOTE) Calculated using the CKD-EPI Creatinine Equation (2021)    Anion gap 19 (H) 5 - 15    Comment: Performed at [facility], [address], [place], [place] [address]  Troponin T, High Sensitivity     Status: None   Collection Time: 08/14/24 11:43 PM  Result Value Ref Range   Troponin T High Sensitivity <15 0 - 19 ng/L    Comment: (NOTE) Biotin concentrations > 1000 ng/mL falsely decrease TnT results.  Serial cardiac troponin measurements are suggested.  Refer to the Links section for chest pain algorithms and additional  guidance. Performed at [facility], [address] [place], [place] [address]   Magnesium     Status: None   Collection Time: 08/14/24 11:43 PM  Result Value Ref Range   Magnesium 2.1 1.7 - 2.4 mg/dL    Comment: Performed at [facility], [address], [place], [place] [address]  Troponin T, High Sensitivity     Status: None   Collection Time: 08/15/24  1:37 AM  Result Value Ref Range   Troponin T High Sensitivity <15 0 - 19 ng/L    Comment: (NOTE) Biotin concentrations > 1000 ng/mL falsely decrease TnT results.  Serial cardiac troponin measurements are suggested.  Refer to the Links section for chest pain algorithms and additional  guidance. Performed at [facility], [address], [place], [place] [address]   TSH     Status: None   Collection Time: 08/15/24  1:37 AM  Result Value Ref Range   TSH 1.550 0.350 - 4.500 uIU/mL    Comment: Performed at [facility], [address], [place], [place] [address]  Basic metabolic panel     Status: Abnormal   Collection Time: 08/15/24  6:13 AM   Result Value Ref Range   Sodium 138 135 - 145 mmol/L   Potassium 4.1 3.5 - 5.1 mmol/L   Chloride 106 98 - 111 mmol/L   CO2 21 (L) 22 - 32 mmol/L   Glucose, Bld 93 70 - 99 mg/dL    Comment: Glucose reference range applies only to samples taken after fasting for at least 8 hours.   BUN 12 6 - 20 mg/dL   Creatinine, Ser 8.91 0.61 - 1.24 mg/dL   Calcium 8.4 (L) 8.9 - 10.3 mg/dL   GFR, Estimated >39 >39 mL/min    Comment: (NOTE) Calculated using the CKD-EPI Creatinine Equation (2021)    Anion gap 11 5 - 15    Comment: Performed at [facility], [address], [place], [place] [address]  CBC     Status: Abnormal   Collection Time: 08/15/24  6:13 AM  Result Value Ref Range   WBC 11.9 (H) 4.0 - 10.5 K/uL   RBC 4.67 4.22 - 5.81 MIL/uL   Hemoglobin 13.7 13.0 - 17.0 g/dL    Comment: REPEATED TO VERIFY Questionable results. Suggest recollect to verify. NOTIFIED M. BARKLEY,RN AT 0719 08/15/2024 BY ZBEECH.    HCT 40.1 39.0 - 52.0 %   MCV 85.9 80.0 - 100.0 fL   MCH 29.3 26.0 - 34.0 pg   MCHC 34.2 30.0 - 36.0 g/dL   RDW 88.0 88.4 - 84.4 %   Platelets 228 150 - 400 K/uL   nRBC 0.0 0.0 - 0.2 %    Comment: Performed at [facility], [address], [place], [place] [address]    ECG   Sinus tachycardia at 125 - Personally Reviewed  Telemetry   Sinus rhythm - Personally Reviewed  Radiology    CT Angio Chest Pulmonary Embolism (PE) W or WO Contrast Result Date: 08/15/2024 EXAM: CTA of the Chest with contrast for PE 08/15/2024 01:13:08 AM TECHNIQUE: CTA of the chest was performed without and with the administration of 75 mL of intravenous iohexol  (OMNIPAQUE ) 350 MG/ML injection. Multiplanar reformatted images are provided for review. MIP images are provided for review. Automated exposure control, iterative reconstruction, and/or weight based adjustment of the mA/kV was utilized to reduce the radiation dose to as low as reasonably achievable. COMPARISON: None available. CLINICAL  HISTORY: Pulmonary embolism (PE)  suspected, high prob. Tachycardia. FINDINGS: PULMONARY ARTERIES: Pulmonary arteries are adequately opacified for evaluation. No pulmonary embolism. Main pulmonary artery is normal in caliber. MEDIASTINUM: The heart and pericardium demonstrate no acute abnormality. There is no acute abnormality of the thoracic aorta. LYMPH NODES: No mediastinal, hilar or axillary lymphadenopathy. LUNGS AND PLEURA: The lungs are without acute process. No focal consolidation or pulmonary edema. No pleural effusion or pneumothorax. UPPER ABDOMEN: Limited images of the upper abdomen are unremarkable. SOFT TISSUES AND BONES: Mild lower thoracic dextroscoliosis. No acute soft tissue abnormality. IMPRESSION: 1. No pulmonary embolism. 2. Negative CT chest. Electronically signed by: Pinkie Pebbles MD 08/15/2024 01:21 AM EST RP Workstation: [id number]   DG Chest Port 1 View Result Date: 08/15/2024 EXAM: 1 VIEW(S) XRAY OF THE CHEST 08/15/2024 12:01:00 AM COMPARISON: 03/06/2016. CLINICAL HISTORY: sob sob FINDINGS: LUNGS AND PLEURA: Questionable left perihilar opacity which could reflect infiltrate and/or pneumonia. No pleural effusion. No pneumothorax. HEART AND MEDIASTINUM: No acute abnormality of the cardiac and mediastinal silhouettes. BONES AND SOFT TISSUES: No acute osseous abnormality. IMPRESSION: 1. Questionable left perihilar opacity, possibly representing infiltrate and/or pneumonia. Electronically signed by: Franky Crease MD 08/15/2024 12:06 AM EST RP Workstation: [id number]    Cardiac Studies   Echo pending  Assessment   Principal Problem:   Tachycardia   Plan   Mr. Vences has a probable SVT for which he was symptomatic (pre-syncopal) - no further events overnight. Will obtain an echo today -if reassuring, arrange for a 2 week zio monitor to be placed in the hospital and he will need follow-up with me or APP after. Will provide PRN metoprolol  tartrate at d/c for recurrent arrythmia.  D/w patient and his mother at the bedside. Anticipate d/c home from the ER after echo/monitor today.  Time Spent Directly with Patient:  I have spent a total of 25 minutes with the patient reviewing hospital notes, telemetry, EKGs, labs and examining the patient as well as establishing an assessment and plan that was discussed personally with the patient.  > 50% of time was spent in direct patient care.  Length of Stay:  LOS: 0 days   Vinie KYM Maxcy, MD, [facility], FNLA, FACP  [place]  [facility]  Medical Director of the [facility] Lip[facility]  Cardiovascular Risk Reduction Clinic Diplomate of the American Board of Clinical Lipidology Attending Cardiologist  Direct Dial: [phone]  Fax: [phone]  Website:  [email or web address] Madaleine Simmon 08/15/2024, 9:28 AM   "

## 2024-08-15 NOTE — Progress Notes (Signed)
 Live 14 day cardiac monitor for tachycardia and possible SVT, Dr Mona to read.

## 2024-08-15 NOTE — Progress Notes (Signed)
 Echocardiogram Exam rescheduled due to team scheduling needs. Will attempt tomorrow.  Franklin Arnold 08/15/2024, 5:25 PM

## 2024-08-16 ENCOUNTER — Observation Stay (HOSPITAL_BASED_OUTPATIENT_CLINIC_OR_DEPARTMENT_OTHER): Payer: Self-pay

## 2024-08-16 DIAGNOSIS — R008 Other abnormalities of heart beat: Secondary | ICD-10-CM

## 2024-08-16 DIAGNOSIS — R000 Tachycardia, unspecified: Secondary | ICD-10-CM | POA: Diagnosis not present

## 2024-08-16 DIAGNOSIS — I4711 Inappropriate sinus tachycardia, so stated: Secondary | ICD-10-CM

## 2024-08-16 DIAGNOSIS — R519 Headache, unspecified: Secondary | ICD-10-CM | POA: Diagnosis not present

## 2024-08-16 LAB — ECHOCARDIOGRAM COMPLETE
Area-P 1/2: 4.15 cm2
Calc EF: 62 %
Height: 71 in
S' Lateral: 2.3 cm
Single Plane A2C EF: 65.8 %
Single Plane A4C EF: 54.9 %
Weight: 2419.77 [oz_av]

## 2024-08-16 MED ORDER — METOPROLOL TARTRATE 25 MG PO TABS: 25.0000 mg | ORAL_TABLET | Freq: Two times a day (BID) | ORAL | 11 refills | Status: AC

## 2024-08-16 MED ORDER — LORAZEPAM 1 MG PO TABS
1.0000 mg | ORAL_TABLET | Freq: Once | ORAL | Status: AC
  Administered 2024-08-16: 1 mg via ORAL
  Filled 2024-08-16: qty 1

## 2024-08-16 NOTE — Progress Notes (Signed)
 Patient had brief episode of HR increase to 150's, patient just received nighttime dose of metoprolol .

## 2024-08-16 NOTE — Consult Note (Addendum)
 "  Cardiology Consultation   Patient ID: Franklin Arnold MRN: [id number]; DOB: 14-Mar-2006  Admit date: 08/14/2024 Date of Consult: 08/16/2024  PCP:  Donzella Lauraine SAILOR, DO   [facility] Providers Cardiologist:  Vinie JAYSON Maxcy, MD     Patient Profile: Franklin Arnold is a 19 y.o. male with a hx of  Autism, ADHD, anxiety/depression who is being seen 08/16/2024 for the evaluation of SVT at the request of Dr. Maxcy.  History of Present Illness: Franklin Arnold came with c/o sudden onset palpitations, fast heart rates associated with SOB.  His Mom found him clammy and called EMS Pt denied ETH/drugs, stimulant use Here found to have episodes of narrow tachycardia towards 170 and admitted for further evaluation   EP is asked to weigh in on SVT and management strategy  LABS K+ 3.7 > 4.1 Mag 2.1 BUN/Creat 15/1.35 > 1.08 HS Trop <15 x2 WBC 12.7 >> 10 H/H 18/53 >> 14/42 Plts 236  Home meds None  EMS reviewed: Initial BP 178/100 >>> 159/92 HR 130's-150's An arrival was laying supine on the floor, tachymenic and anxious Mother reported he had been on clonidine  since age 36 and stopped it ~ 1 week prior (his Mom corrects this with me today > this was nearly 2 years ago) Tracings are ST EKGs also ST 152 and 154bpm  The patient reports that he was playing a video game, relaxed, nothing unusual when his heart started racing, made him feel weak, with a cold sensation to the back of his head and pressure in his eyes. He went to him mom, she report his as weak, perhaps a little clammy, called EMS  He was on clonidine  sine ~ 19 y/o at HS to help with sleep/ocking behavior, dose increased over the years 2 years ago stopped abruptly (pt self stopped) and did have HTN urgency managed in the ER back then, but not resumed  He has had one fainting episode years ago after a single trazodone dose  He has never had any kind of cardiac symptoms, awareness in the past   Past Medical History:   Diagnosis Date   ADHD (attention deficit hyperactivity disorder)    Allergy    Anxiety    Autism spectrum disorder 2015   Depression    Hearing impairment 2012   RIGHT   Insomnia     Past Surgical History:  Procedure Laterality Date   ADENOIDECTOMY N/A 2006     Home Medications:  Prior to Admission medications  Not on File    Scheduled Meds:  metoprolol  tartrate  25 mg Oral BID   Continuous Infusions:  PRN Meds: acetaminophen , iohexol , ondansetron  (ZOFRAN ) IV, polyethylene glycol  Allergies:   Allergies[1]  Social History:   Social History   Socioeconomic History   Marital status: Single    Spouse name: Not on file   Number of children: Not on file   Years of education: Not on file   Highest education level: Not on file  Occupational History   Occupation: student    Comment: [employer]  Tobacco Use   Smoking status: Never   Smokeless tobacco: Never  Vaping Use   Vaping status: Never Used  Substance and Sexual Activity   Alcohol use: No   Drug use: No   Sexual activity: Never  Other Topics Concern   Not on file  Social History Narrative   Parents are divorced.   Lives with his mother, one brother and their cat.  Two brothers and sister live independently in [place], [place].   His father is remarried with children and lives in [place], [place].   The patient desires more contact with his father.   Social Drivers of Health   Tobacco Use: Low Risk (08/14/2024)   Patient History    Smoking Tobacco Use: Never    Smokeless Tobacco Use: Never    Passive Exposure: Not on file  Financial Resource Strain: Not on file  Food Insecurity: No Food Insecurity (08/15/2024)   Epic    Worried About Programme Researcher, Broadcasting/film/video in the Last Year: Never true    Ran Out of Food in the Last Year: Never true  Transportation Needs: No Transportation Needs (08/15/2024)   Epic    Lack of Transportation (Medical): No    Lack of Transportation (Non-Medical): No   Physical Activity: Not on file  Stress: Not on file  Social Connections: Not on file  Intimate Partner Violence: Not At Risk (08/15/2024)   Epic    Fear of Current or Ex-Partner: No    Emotionally Abused: No    Physically Abused: No    Sexually Abused: No  Depression (PHQ2-9): Medium Risk (12/24/2022)   Depression (PHQ2-9)    PHQ-2 Score: 10  Alcohol Screen: Low Risk (12/24/2022)   Alcohol Screen    Last Alcohol Screening Score (AUDIT): 0  Housing: Low Risk (08/15/2024)   Epic    Unable to Pay for Housing in the Last Year: No    Number of Times Moved in the Last Year: 0    Homeless in the Last Year: No  Utilities: Not At Risk (08/15/2024)   Epic    Threatened with loss of utilities: No  Health Literacy: Not on file    Family History:   Family History  Problem Relation Age of Onset   Hypertension Mother    Hypertension Father    Diabetes Father    ADD / ADHD Brother    Schizophrenia Paternal Aunt    Heart failure Maternal Grandmother    Bipolar disorder Paternal Grandmother      ROS:  Please see the history of present illness.  All other ROS reviewed and negative.     Physical Exam/Data: Vitals:   08/15/24 2305 08/16/24 0451 08/16/24 0743 08/16/24 0901  BP: 120/81 115/73 (!) 125/95 121/89  Pulse: 81 81 76 86  Resp: 20 15 17    Temp: 98.1 F (36.7 C) 98.5 F (36.9 C) 100 F (37.8 C)   TempSrc: Oral Oral Oral   SpO2: 97% 97%    Weight:      Height:        Intake/Output Summary (Last 24 hours) at 08/16/2024 1034 Last data filed at 08/16/2024 0743 Gross per 24 hour  Intake 240 ml  Output --  Net 240 ml      08/15/2024    8:15 PM 08/14/2024   11:36 PM 12/24/2022    8:32 AM  Last 3 Weights  Weight (lbs) 151 lb 3.8 oz 141 lb 1.5 oz 139 lb 1.6 oz  Weight (kg) 68.6 kg 64 kg 63.095 kg     Body mass index is 21.09 kg/m.  General:  Well nourished, well developed, in no acute distress, healthy appearing young male HEENT: normal Neck: no JVD Vascular: No carotid  bruits; Distal pulses 2+ bilaterally Cardiac:  RRR; no murmur Lungs: CTA b/l, no wheezing, rhonchi or rales  Abd: soft, nontender, no hepatomegaly  Ext: no edema Musculoskeletal:  No deformities Skin:  warm and dry  Neuro:  no focal abnormalities noted Psych:  Normal affect   EKG:  The EKG was personally reviewed and demonstrates:    ST  129bpm ST 125bpm ST 124bpm  Telemetry:  Telemetry was personally reviewed and demonstrates:   SR, ST In review, dose seem to develop ST that revs up towards the 160s Some clearly sinus (vs and AT)  Relevant CV Studies:  Ordered, pending  Laboratory Data: High Sensitivity Troponin:  No results for input(s): TROPONINIHS in the last 720 hours.  Recent Labs  Lab 08/14/24 2343 08/15/24 0137  TRNPT <15 <15      Chemistry Recent Labs  Lab 08/14/24 2343 08/15/24 0613  NA 138 138  K 3.7 4.1  CL 97* 106  CO2 22 21*  GLUCOSE 135* 93  BUN 15 12  CREATININE 1.35* 1.08  CALCIUM 10.2 8.4*  MG 2.1  --   GFRNONAA >60 >60  ANIONGAP 19* 11    No results for input(s): PROT, ALBUMIN, AST, ALT, ALKPHOS, BILITOT in the last 168 hours. Lipids No results for input(s): CHOL, TRIG, HDL, LABVLDL, LDLCALC, CHOLHDL in the last 168 hours.  Hematology Recent Labs  Lab 08/14/24 2343 08/15/24 0613 08/15/24 1127  WBC 12.7* 11.9* 10.0  RBC 6.32* 4.67 5.04  HGB 18.8* 13.7 14.7  HCT 53.9* 40.1 42.8  MCV 85.3 85.9 84.9  MCH 29.7 29.3 29.2  MCHC 34.9 34.2 34.3  RDW 11.8 11.9 12.0  PLT 306 228 236   Thyroid   Recent Labs  Lab 08/15/24 0137  TSH 1.550    BNPNo results for input(s): BNP, PROBNP in the last 168 hours.  DDimer No results for input(s): DDIMER in the last 168 hours.  Radiology/Studies:  CT HEAD WO CONTRAST ( ) Result Date: 08/15/2024 EXAM: CT HEAD WITHOUT CONTRAST 08/15/2024 05:28:00 PM TECHNIQUE: CT of the head was performed without the administration of intravenous contrast. Automated exposure  control, iterative reconstruction, and/or weight based adjustment of the mA/kV was utilized to reduce the radiation dose to as low as reasonably achievable. COMPARISON: None available. CLINICAL HISTORY: Headache, increasing frequency or severity; right posterior orbital pain, recent recurrent SVT FINDINGS: BRAIN AND VENTRICLES: No acute hemorrhage. No evidence of acute infarct. No hydrocephalus. No extra-axial collection. No mass effect or midline shift. ORBITS: No acute abnormality. SINUSES: No acute abnormality. SOFT TISSUES AND SKULL: No acute soft tissue abnormality. No skull fracture. IMPRESSION: 1. No acute intracranial abnormality. Electronically signed by: Franky Crease MD 08/15/2024 05:33 PM EST RP Workstation: [id number]   CT Angio Chest Pulmonary Embolism (PE) W or WO Contrast Result Date: 08/15/2024 EXAM: CTA of the Chest with contrast for PE 08/15/2024 01:13:08 AM TECHNIQUE: CTA of the chest was performed without and with the administration of 75 mL of intravenous iohexol  (OMNIPAQUE ) 350 MG/ML injection. Multiplanar reformatted images are provided for review. MIP images are provided for review. Automated exposure control, iterative reconstruction, and/or weight based adjustment of the mA/kV was utilized to reduce the radiation dose to as low as reasonably achievable. COMPARISON: None available. CLINICAL HISTORY: Pulmonary embolism (PE) suspected, high prob. Tachycardia. FINDINGS: PULMONARY ARTERIES: Pulmonary arteries are adequately opacified for evaluation. No pulmonary embolism. Main pulmonary artery is normal in caliber. MEDIASTINUM: The heart and pericardium demonstrate no acute abnormality. There is no acute abnormality of the thoracic aorta. LYMPH NODES: No mediastinal, hilar or axillary lymphadenopathy. LUNGS AND PLEURA: The lungs are without acute process. No focal consolidation or pulmonary edema. No pleural effusion or pneumothorax. UPPER ABDOMEN: Limited images  of the upper abdomen are  unremarkable. SOFT TISSUES AND BONES: Mild lower thoracic dextroscoliosis. No acute soft tissue abnormality. IMPRESSION: 1. No pulmonary embolism. 2. Negative CT chest. Electronically signed by: Pinkie Pebbles MD 08/15/2024 01:21 AM EST RP Workstation: [id number]      Assessment and Plan:  Palpitations Narrow complex tachycardia Agree, does seem to be a ST,  Low dose lopressor  as improved burden of tachycardia Echo is pending BPs largely normal, some high Perhaps titrate up BB to tolerability His BP with EMS was quite elevated (? Anxious)  Will review with EP MD, will see him later this morning/afternoon Final recommendations to follow  ADEND Dr. Nancey has reviewed case/EKGs, telemetry, been bedside and discussed with the patient/parents Agrees this is all ST, and initiation of betablocker management.   Further with Dr. Lino, for follow up, titration strategies as needed.    For questions or updates, please contact [facility] Please consult [email or web address] for contact info under   Signed, Franklin Macario Arthur, PA-C  08/16/2024 10:34 AM;t     [1]  Allergies Allergen Reactions   Soy Allergy (Obsolete) Anaphylaxis   Trazodone And Nefazodone Other (See Comments)    BP 50/29 with HR 49; very bad experience; got to [facility]   "

## 2024-08-16 NOTE — Progress Notes (Signed)
" ° °  Notified by RN that patient again started to feel palpitations. Patient denies any anxiety or caffeine intake. Tells me that the palpitations occur randomly. On telemetry, HR currently in the 110s. BP 138/97  Reassured patient that his workup from a cardiac perspective has been reassuring. Discussed that sinus tachycardia is often response to something else going on, whether that be caffeine, certain foods, dehydration, anxiety, illness. Patient unable to identify any triggers at this time   Patient's father asked if something for anxiety could be helpful. Patient does appear to be anxious on my exam. Patient's parents have reached out to their primary care provider to discuss long-term anxiety treatment. Asked if patient could have a dose of ativan  before leaving the hospital to help calm him down before he gets home  Ordered a one-time dose of ativan .   Rollo FABIENE Louder, PA-C 08/16/2024 6:08 PM  "

## 2024-08-16 NOTE — Progress Notes (Signed)
 "   DAILY PROGRESS NOTE   Patient Name: Franklin Arnold Date of Encounter: 08/16/2024 Cardiologist: None  Chief Complaint   Feels better  Patient Profile   Franklin Arnold is a 19 y.o. male with a hx of autism who is being seen 08/15/2024 for the evaluation of tachycardia at the request of Dr. Theadore.   Subjective   Noted to have brief tachycardia overnight into the 150's - HR improved today. CT head normal.  Echo pending. Headache is improving.  Objective   Vitals:   08/15/24 2015 08/15/24 2305 08/16/24 0451 08/16/24 0743  BP: 133/89 120/81 115/73 (!) 125/95  Pulse: 92 81 81 76  Resp: 18 20 15 17   Temp: 99.3 F (37.4 C) 98.1 F (36.7 C) 98.5 F (36.9 C) 100 F (37.8 C)  TempSrc: Oral Oral Oral Oral  SpO2: 97% 97% 97%   Weight: 68.6 kg     Height: 5' 11 (1.803 m)      No intake or output data in the 24 hours ending 08/16/24 0816 Filed Weights   08/14/24 2336 08/15/24 2015  Weight: 64 kg 68.6 kg    Physical Exam   General appearance: alert and no distress Lungs: clear to auscultation bilaterally Heart: regular rate and rhythm, S1, S2 normal, no murmur, click, rub or gallop Extremities: extremities normal, atraumatic, no cyanosis or edema Neurologic: Grossly normal  Inpatient Medications    Scheduled Meds:  metoprolol  tartrate  25 mg Oral BID    Continuous Infusions:   PRN Meds: acetaminophen , iohexol , ondansetron  (ZOFRAN ) IV, polyethylene glycol   Labs   Results for orders placed or performed during the hospital encounter of 08/14/24 (from the past 48 hours)  CBC     Status: Abnormal   Collection Time: 08/14/24 11:43 PM  Result Value Ref Range   WBC 12.7 (H) 4.0 - 10.5 K/uL   RBC 6.32 (H) 4.22 - 5.81 MIL/uL   Hemoglobin 18.8 (H) 13.0 - 17.0 g/dL   HCT 46.0 (H) 60.9 - 47.9 %   MCV 85.3 80.0 - 100.0 fL   MCH 29.7 26.0 - 34.0 pg   MCHC 34.9 30.0 - 36.0 g/dL   RDW 88.1 88.4 - 84.4 %   Platelets 306 150 - 400 K/uL   nRBC 0.0 0.0 - 0.2 %     Comment: Performed at [facility], [address], [place], [place] [address]  Basic metabolic panel with GFR     Status: Abnormal   Collection Time: 08/14/24 11:43 PM  Result Value Ref Range   Sodium 138 135 - 145 mmol/L   Potassium 3.7 3.5 - 5.1 mmol/L   Chloride 97 (L) 98 - 111 mmol/L   CO2 22 22 - 32 mmol/L   Glucose, Bld 135 (H) 70 - 99 mg/dL    Comment: Glucose reference range applies only to samples taken after fasting for at least 8 hours.   BUN 15 6 - 20 mg/dL   Creatinine, Ser 8.64 (H) 0.61 - 1.24 mg/dL   Calcium 89.7 8.9 - 89.6 mg/dL   GFR, Estimated >39 >39 mL/min    Comment: (NOTE) Calculated using the CKD-EPI Creatinine Equation (2021)    Anion gap 19 (H) 5 - 15    Comment: Performed at [facility], [address], [place], [place] [address]  Troponin T, High Sensitivity     Status: None   Collection Time: 08/14/24 11:43 PM  Result Value Ref Range   Troponin T High  Sensitivity <15 0 - 19 ng/L    Comment: (NOTE) Biotin concentrations > 1000 ng/mL falsely decrease TnT results.  Serial cardiac troponin measurements are suggested.  Refer to the Links section for chest pain algorithms and additional  guidance. Performed at [facility], [address] [place], [place] [address]   Magnesium     Status: None   Collection Time: 08/14/24 11:43 PM  Result Value Ref Range   Magnesium 2.1 1.7 - 2.4 mg/dL    Comment: Performed at [facility], [address] [place], [place] [address]  Troponin T, High Sensitivity     Status: None   Collection Time: 08/15/24  1:37 AM  Result Value Ref Range   Troponin T High Sensitivity <15 0 - 19 ng/L    Comment: (NOTE) Biotin concentrations > 1000 ng/mL falsely decrease TnT results.  Serial cardiac troponin measurements are suggested.  Refer to the Links section for chest pain algorithms and additional  guidance. Performed at [facility], [address], [place], [place] [address]   TSH      Status: None   Collection Time: 08/15/24  1:37 AM  Result Value Ref Range   TSH 1.550 0.350 - 4.500 uIU/mL    Comment: Performed at [facility], [address], [place], [place] [address]  Basic metabolic panel     Status: Abnormal   Collection Time: 08/15/24  6:13 AM  Result Value Ref Range   Sodium 138 135 - 145 mmol/L   Potassium 4.1 3.5 - 5.1 mmol/L   Chloride 106 98 - 111 mmol/L   CO2 21 (L) 22 - 32 mmol/L   Glucose, Bld 93 70 - 99 mg/dL    Comment: Glucose reference range applies only to samples taken after fasting for at least 8 hours.   BUN 12 6 - 20 mg/dL   Creatinine, Ser 8.91 0.61 - 1.24 mg/dL   Calcium 8.4 (L) 8.9 - 10.3 mg/dL   GFR, Estimated >39 >39 mL/min    Comment: (NOTE) Calculated using the CKD-EPI Creatinine Equation (2021)    Anion gap 11 5 - 15    Comment: Performed at [facility], [address], [place], [place] [address]  CBC     Status: Abnormal   Collection Time: 08/15/24  6:13 AM  Result Value Ref Range   WBC 11.9 (H) 4.0 - 10.5 K/uL   RBC 4.67 4.22 - 5.81 MIL/uL   Hemoglobin 13.7 13.0 - 17.0 g/dL    Comment: REPEATED TO VERIFY Questionable results. Suggest recollect to verify. NOTIFIED M. BARKLEY,RN AT 0719 08/15/2024 BY ZBEECH.    HCT 40.1 39.0 - 52.0 %   MCV 85.9 80.0 - 100.0 fL   MCH 29.3 26.0 - 34.0 pg   MCHC 34.2 30.0 - 36.0 g/dL   RDW 88.0 88.4 - 84.4 %   Platelets 228 150 - 400 K/uL   nRBC 0.0 0.0 - 0.2 %    Comment: Performed at [facility], [address], [place], [place] [address]  CBC     Status: None   Collection Time: 08/15/24 11:27 AM  Result Value Ref Range   WBC 10.0 4.0 - 10.5 K/uL   RBC 5.04 4.22 - 5.81 MIL/uL   Hemoglobin 14.7 13.0 - 17.0 g/dL   HCT 57.1 60.9 - 47.9 %   MCV 84.9 80.0 - 100.0 fL   MCH 29.2 26.0 - 34.0 pg   MCHC 34.3 30.0 -  36.0 g/dL   RDW 87.9 88.4 - 84.4 %   Platelets 236 150 - 400 K/uL   nRBC 0.0 0.0 - 0.2 %    Comment: Performed at [facility], [address],  [place], [place] [address]  MRSA Next Gen by PCR, Nasal     Status: None   Collection Time: 08/15/24  8:18 PM   Specimen: Nasal Mucosa; Nasal Swab  Result Value Ref Range   MRSA by PCR Next Gen NOT DETECTED NOT DETECTED    Comment: (NOTE) The GeneXpert MRSA Assay (FDA approved for NASAL specimens only), is one component of a comprehensive MRSA colonization surveillance program. It is not intended to diagnose MRSA infection nor to guide or monitor treatment for MRSA infections. Test performance is not FDA approved in patients less than 13 years old. Performed at [facility], [address] [place], [place] [address]     ECG   N/A  Telemetry   Sinus rhythm, brief episode of tachycardia - Personally Reviewed  Radiology    CT HEAD WO CONTRAST ( ) Result Date: 08/15/2024 EXAM: CT HEAD WITHOUT CONTRAST 08/15/2024 05:28:00 PM TECHNIQUE: CT of the head was performed without the administration of intravenous contrast. Automated exposure control, iterative reconstruction, and/or weight based adjustment of the mA/kV was utilized to reduce the radiation dose to as low as reasonably achievable. COMPARISON: None available. CLINICAL HISTORY: Headache, increasing frequency or severity; right posterior orbital pain, recent recurrent SVT FINDINGS: BRAIN AND VENTRICLES: No acute hemorrhage. No evidence of acute infarct. No hydrocephalus. No extra-axial collection. No mass effect or midline shift. ORBITS: No acute abnormality. SINUSES: No acute abnormality. SOFT TISSUES AND SKULL: No acute soft tissue abnormality. No skull fracture. IMPRESSION: 1. No acute intracranial abnormality. Electronically signed by: Franky Crease MD 08/15/2024 05:33 PM EST RP Workstation: [id number]   CT Angio Chest Pulmonary Embolism (PE) W or WO Contrast Result Date: 08/15/2024 EXAM: CTA of the Chest with contrast for PE 08/15/2024 01:13:08 AM TECHNIQUE: CTA of the chest was performed without and with the administration of 75  mL of intravenous iohexol  (OMNIPAQUE ) 350 MG/ML injection. Multiplanar reformatted images are provided for review. MIP images are provided for review. Automated exposure control, iterative reconstruction, and/or weight based adjustment of the mA/kV was utilized to reduce the radiation dose to as low as reasonably achievable. COMPARISON: None available. CLINICAL HISTORY: Pulmonary embolism (PE) suspected, high prob. Tachycardia. FINDINGS: PULMONARY ARTERIES: Pulmonary arteries are adequately opacified for evaluation. No pulmonary embolism. Main pulmonary artery is normal in caliber. MEDIASTINUM: The heart and pericardium demonstrate no acute abnormality. There is no acute abnormality of the thoracic aorta. LYMPH NODES: No mediastinal, hilar or axillary lymphadenopathy. LUNGS AND PLEURA: The lungs are without acute process. No focal consolidation or pulmonary edema. No pleural effusion or pneumothorax. UPPER ABDOMEN: Limited images of the upper abdomen are unremarkable. SOFT TISSUES AND BONES: Mild lower thoracic dextroscoliosis. No acute soft tissue abnormality. IMPRESSION: 1. No pulmonary embolism. 2. Negative CT chest. Electronically signed by: Pinkie Pebbles MD 08/15/2024 01:21 AM EST RP Workstation: [id number]   DG Chest Port 1 View Result Date: 08/15/2024 EXAM: 1 VIEW(S) XRAY OF THE CHEST 08/15/2024 12:01:00 AM COMPARISON: 03/06/2016. CLINICAL HISTORY: sob sob FINDINGS: LUNGS AND PLEURA: Questionable left perihilar opacity which could reflect infiltrate and/or pneumonia. No pleural effusion. No pneumothorax. HEART AND MEDIASTINUM: No acute abnormality of the cardiac and mediastinal silhouettes. BONES AND SOFT TISSUES: No acute osseous abnormality. IMPRESSION: 1. Questionable left perihilar opacity, possibly representing infiltrate  and/or pneumonia. Electronically signed by: Franky Crease MD 08/15/2024 12:06 AM EST RP Workstation: [id number]    Cardiac Studies   N/A  Assessment   Principal Problem:    Tachycardia Active Problems:   Headache around the eyes   Plan   Mr. Trostel reports improvement in headache - CT negative. Brief tachycardia overnight- wonder if this is inappropriate sinus tachycardia, rather than atrial tachycardia - onset is gradual (minutes), rather than abrupt. D/w patient and father at bedside - they would like EP opinion. Echo pending. Will ask EP to weigh-in today.  He has had the Zio patch placed - anticipate d/c later today.  Time Spent Directly with Patient:  I have spent a total of 25 minutes with the patient reviewing hospital notes, telemetry, EKGs, labs and examining the patient as well as establishing an assessment and plan that was discussed personally with the patient.  > 50% of time was spent in direct patient care.  Length of Stay:  LOS: 0 days   Vinie KYM Maxcy, MD, [facility], FNLA, FACP  [place]  [facility]  Medical Director of the [facility] Lipid Disorders &  Cardiovascular Risk Reduction Clinic Diplomate of the American Board of Clinical Lipidology Attending Cardiologist  Direct Dial: [phone]  Fax: [phone]  Website:  [email or web address] Vinie BROCKS Pat Elicker 08/16/2024, 8:16 AM   "

## 2024-08-16 NOTE — Plan of Care (Signed)
" °  Problem: Education: Goal: Understanding of cardiac disease, CV risk reduction, and recovery process will improve Outcome: Progressing Goal: Individualized Educational Video(s) Outcome: Progressing   Problem: Activity: Goal: Ability to tolerate increased activity will improve Outcome: Progressing   Problem: Cardiac: Goal: Ability to achieve and maintain adequate cardiovascular perfusion will improve Outcome: Progressing   Problem: Health Behavior/Discharge Planning: Goal: Ability to safely manage health-related needs after discharge will improve Outcome: Progressing   Problem: Education: Goal: Knowledge of General Education information will improve Description: Including pain rating scale, medication(s)/side effects and non-pharmacologic comfort measures Outcome: Progressing   Problem: Health Behavior/Discharge Planning: Goal: Ability to manage health-related needs will improve Outcome: Progressing   Problem: Clinical Measurements: Goal: Ability to maintain clinical measurements within normal limits will improve Outcome: Progressing Goal: Will remain free from infection Outcome: Progressing Goal: Diagnostic test results will improve Outcome: Progressing Goal: Respiratory complications will improve Outcome: Progressing Goal: Cardiovascular complication will be avoided Outcome: Progressing   Problem: Activity: Goal: Risk for activity intolerance will decrease Outcome: Progressing   Problem: Nutrition: Goal: Adequate nutrition will be maintained Outcome: Progressing   Problem: Pain Managment: Goal: General experience of comfort will improve and/or be controlled Outcome: Progressing   Problem: Safety: Goal: Ability to remain free from injury will improve Outcome: Progressing   "

## 2024-08-16 NOTE — Progress Notes (Signed)
" °  Echocardiogram 2D Echocardiogram has been performed.  Devora Ellouise SAUNDERS 08/16/2024, 3:34 PM "

## 2024-08-16 NOTE — TOC CM/SW Note (Signed)
 Transition of Care [facility]) - Inpatient Brief Assessment   Patient Details  Name: Franklin Arnold MRN: [id number] Date of Birth: 11/19/2005  Transition of Care [facility]) CM/SW Contact:    Roxie KANDICE Stain, RN Phone Number: 08/16/2024, 3:12 PM   Clinical Narrative:  Patient admitted for tachycardia.  EP consult. No ICM needs at this time.  Transition of Care Asessment: Insurance and Status: Insurance coverage has been reviewed (Patient confirmed patient has insurance) Patient has primary care physician: Yes Home environment has been reviewed: Safe to discharge home Prior level of function:: Independent Prior/Current Home Services: No current home services Social Drivers of Health Review: SDOH reviewed no interventions necessary Readmission risk has been reviewed: Yes Transition of care needs: no transition of care needs at this time

## 2024-08-17 ENCOUNTER — Ambulatory Visit: Payer: Self-pay

## 2024-08-17 NOTE — Telephone Encounter (Signed)
 FYI Only or Action Required?: FYI only for provider: appointment scheduled on 08/18/24.  Patient was last seen in primary care on 12/24/2022 by Donzella Lauraine SAILOR, DO.  Called Nurse Triage reporting Anxiety.  Symptoms began 08/14/24.  Interventions attempted: Other: pt was evaluated in hospital for elevated HR and BP, .  Symptoms are: stable.  Triage Disposition: See PCP When Office is Open (Within 3 Days)  Patient/caregiver understands and will follow disposition?: yes   Message from Victoria B sent at 08/17/2024  8:56 AM EST  Reason for Triage: Patient has anxiety   Reason for Disposition  [1] Anxiety symptoms AND [2] has not been evaluated for this by doctor (or NP/PA)  Answer Assessment - Initial Assessment Questions 1. CONCERN: Did anything happen that prompted you to call today?      Son wringing hands and rocking to self soothe 2. ANXIETY SYMPTOMS: Can you describe how you (your loved one; patient) have been feeling? (e.g., tense, restless, panicky, anxious, keyed up, overwhelmed, sense of impending doom).      anxious 3. ONSET: How long have you been feeling this way? (e.g., hours, days, weeks)     08/14/24 4. SEVERITY: How would you rate the level of anxiety? (e.g., 0 - 10; or mild, moderate, severe).     mild 5. FUNCTIONAL IMPAIRMENT: How have these feelings affected your ability to do daily activities? Have you had more difficulty than usual doing your normal daily activities? (e.g., getting better, same, worse; self-care, school, work, interactions)     Mother not with pt 6. HISTORY: Have you felt this way before? Have you ever been diagnosed with an anxiety problem in the past? (e.g., generalized anxiety disorder, panic attacks, PTSD). If Yes, ask: How was this problem treated? (e.g., medicines, counseling, etc.)     Had elevated HR and BP and felt like he was going to pass out on the 19th- never dx with anxiety 7. RISK OF HARM - SUICIDAL IDEATION: Do you  ever have thoughts of hurting or killing yourself? If Yes, ask:  Do you have these feelings now? Do you have a plan on how you would do this?     no 8. TREATMENT:  What has been done so far to treat this anxiety? (e.g., medicines, relaxation strategies). What has helped?     Mother stated she may give him clonidine  to take edge off.  9. THERAPIST: Do you have a counselor or therapist? If Yes, ask: What is their name?     no 10. POTENTIAL TRIGGERS: Do you drink caffeinated beverages (e.g., coffee, colas, teas), and how much daily? Do you drink alcohol or use any drugs? Have you started any new medicines recently?       Seeing his BP elevated and feeling out of control in regards to his hyperventilation and on the 19 th stated he felt like he couldn't breathe 11. PATIENT SUPPORT: Who is with you now? Who do you live with? Do you have family or friends who you can talk to?        Father 74. OTHER SYMPTOMS: Do you have any other symptoms? (e.g., feeling depressed, trouble concentrating, trouble sleeping, trouble breathing, palpitations or fast heartbeat, chest pain, sweating, nausea, or diarrhea)       Fast heart rate of 109 BP 142/90  Protocols used: Anxiety and Panic Attack-A-AH

## 2024-08-18 ENCOUNTER — Ambulatory Visit (INDEPENDENT_AMBULATORY_CARE_PROVIDER_SITE_OTHER): Payer: Self-pay | Admitting: Family Medicine

## 2024-08-18 ENCOUNTER — Encounter: Payer: Self-pay | Admitting: Family Medicine

## 2024-08-18 VITALS — BP 129/79 | HR 86 | Temp 98.0°F | Ht 71.0 in | Wt 149.6 lb

## 2024-08-18 DIAGNOSIS — I341 Nonrheumatic mitral (valve) prolapse: Secondary | ICD-10-CM | POA: Insufficient documentation

## 2024-08-18 DIAGNOSIS — F410 Panic disorder [episodic paroxysmal anxiety] without agoraphobia: Secondary | ICD-10-CM

## 2024-08-18 DIAGNOSIS — Z09 Encounter for follow-up examination after completed treatment for conditions other than malignant neoplasm: Secondary | ICD-10-CM

## 2024-08-18 DIAGNOSIS — F840 Autistic disorder: Secondary | ICD-10-CM

## 2024-08-18 DIAGNOSIS — M419 Scoliosis, unspecified: Secondary | ICD-10-CM | POA: Insufficient documentation

## 2024-08-18 DIAGNOSIS — I4711 Inappropriate sinus tachycardia, so stated: Secondary | ICD-10-CM

## 2024-08-18 DIAGNOSIS — B9789 Other viral agents as the cause of diseases classified elsewhere: Secondary | ICD-10-CM

## 2024-08-18 MED ORDER — FLUTICASONE PROPIONATE 50 MCG/ACT NA SUSP: 2.0000 | Freq: Every day | NASAL | 6 refills | Status: AC

## 2024-08-18 MED ORDER — LORAZEPAM 0.5 MG PO TABS: 0.2500 mg | ORAL_TABLET | Freq: Two times a day (BID) | ORAL | 0 refills | Status: AC | PRN

## 2024-08-18 NOTE — Progress Notes (Unsigned)
 "     Established patient visit   Patient: Franklin Arnold   DOB: 02-Jan-2006   18 y.o. Male  MRN: [id number] Visit Date: 08/18/2024  Today's healthcare provider: LAURAINE LOISE BUOY, DO   Chief Complaint  Patient presents with   Hospitalization Follow-up    Patient was admitted to the hospital on 08/14/2024 was discharged on 08/16/2024 due to tachycardia heart rate was 178 when he was admitted, was put on Metoprolol  Tartrate 25 MG twice daily.  Reports that since get out the hospital he is feeling a little better and is currently wearing a zio monitor for 14 days.   Subjective    HPI    1/19-1/21 hospital  ***  {History (Optional):23778}  Medications: Show/hide medication list[1]  Review of Systems  HENT:  Positive for sinus pressure.    ***  {Insert previous labs (optional):23779} {See past labs  Heme  Chem  Endocrine  Serology  Results Review (optional):1}   Objective    BP 129/79 (BP Location: Left Arm, Patient Position: Sitting, Cuff Size: Normal)   Pulse 86   Temp 98 F (36.7 C) (Oral)   Ht 5' 11 (1.803 m)   Wt 149 lb 9.6 oz (67.9 kg)   SpO2 98%   BMI 20.86 kg/m  {Insert last BP/Wt (optional):23777}{See vitals history (optional):1}   Physical Exam Constitutional:      Appearance: Normal appearance.  HENT:     Head: Normocephalic and atraumatic.  Eyes:     General: No scleral icterus.    Extraocular Movements: Extraocular movements intact.     Conjunctiva/sclera: Conjunctivae normal.  Cardiovascular:     Rate and Rhythm: Normal rate and regular rhythm.     Pulses: Normal pulses.     Heart sounds: Normal heart sounds.  Pulmonary:     Effort: Pulmonary effort is normal. No respiratory distress.     Breath sounds: Normal breath sounds.  Abdominal:     General: Bowel sounds are normal.     Palpations: Abdomen is soft.  Musculoskeletal:     Right lower leg: No edema.     Left lower leg: No edema.  Skin:    General: Skin is warm and dry.   Neurological:     Mental Status: He is alert and oriented to person, place, and time. Mental status is at baseline.  Psychiatric:        Mood and Affect: Mood normal.        Behavior: Behavior normal.      No results found for any visits on 08/18/24.  Assessment & Plan    Inappropriate sinus tachycardia  Hospital discharge follow-up  Autism spectrum disorder  Mitral valve prolapse  Scoliosis of thoracic spine, unspecified scoliosis type    ***  No follow-ups on file.      I discussed the assessment and treatment plan with the patient  The patient was provided an opportunity to ask questions and all were answered. The patient agreed with the plan and demonstrated an understanding of the instructions.   The patient was advised to call back or seek an in-person evaluation if the symptoms worsen or if the condition fails to improve as anticipated.    LAURAINE LOISE BUOY, DO  [facility] [phone] (phone) [phone] (fax)  [facility]    [1]  Outpatient Medications Prior to Visit  Medication Sig   metoprolol  tartrate (LOPRESSOR ) 25 MG tablet Take 1 tablet (25 mg total) by mouth 2 (  two) times daily.   No facility-administered medications prior to visit.   "

## 2024-08-29 ENCOUNTER — Encounter: Payer: Self-pay | Admitting: Family Medicine

## 2024-08-30 NOTE — Telephone Encounter (Signed)
 I didn't see at the last visit where this was discussed with you.  Will he need an appointment regarding these referrals?  Please advise.

## 2024-09-13 ENCOUNTER — Ambulatory Visit: Payer: Self-pay | Admitting: Physician Assistant

## 2024-09-27 ENCOUNTER — Ambulatory Visit: Payer: Self-pay | Admitting: General Practice

## 2024-09-29 ENCOUNTER — Ambulatory Visit: Payer: Self-pay | Admitting: Family Medicine
